# Patient Record
Sex: Female | Born: 1970 | Race: White | Hispanic: No | Marital: Single | Smoking: Current every day smoker
Health system: Southern US, Community
[De-identification: ages and names within clinical notes are randomized; demographics above are authoritative.]

## PROBLEM LIST (undated history)

## (undated) DIAGNOSIS — R569 Unspecified convulsions: Secondary | ICD-10-CM

---

## 1997-09-24 ENCOUNTER — Other Ambulatory Visit: Admission: RE | Admit: 1997-09-24 | Discharge: 1997-09-24 | Payer: Self-pay | Admitting: Obstetrics

## 1997-11-04 ENCOUNTER — Emergency Department (HOSPITAL_COMMUNITY): Admission: EM | Admit: 1997-11-04 | Discharge: 1997-11-04 | Payer: Self-pay | Admitting: Emergency Medicine

## 1998-07-21 ENCOUNTER — Emergency Department (HOSPITAL_COMMUNITY): Admission: EM | Admit: 1998-07-21 | Discharge: 1998-07-21 | Payer: Self-pay | Admitting: Internal Medicine

## 1999-01-31 ENCOUNTER — Other Ambulatory Visit: Admission: RE | Admit: 1999-01-31 | Discharge: 1999-01-31 | Payer: Self-pay | Admitting: Family Medicine

## 1999-08-12 ENCOUNTER — Encounter: Payer: Self-pay | Admitting: Obstetrics and Gynecology

## 1999-08-12 ENCOUNTER — Encounter: Admission: RE | Admit: 1999-08-12 | Discharge: 1999-08-12 | Payer: Self-pay | Admitting: Obstetrics and Gynecology

## 1999-11-15 ENCOUNTER — Emergency Department (HOSPITAL_COMMUNITY): Admission: EM | Admit: 1999-11-15 | Discharge: 1999-11-16 | Payer: Self-pay | Admitting: Emergency Medicine

## 2002-01-28 ENCOUNTER — Encounter: Admission: RE | Admit: 2002-01-28 | Discharge: 2002-01-28 | Payer: Self-pay | Admitting: Family Medicine

## 2002-01-28 ENCOUNTER — Encounter: Payer: Self-pay | Admitting: Family Medicine

## 2002-06-09 ENCOUNTER — Emergency Department (HOSPITAL_COMMUNITY): Admission: EM | Admit: 2002-06-09 | Discharge: 2002-06-09 | Payer: Self-pay | Admitting: Emergency Medicine

## 2002-06-09 ENCOUNTER — Encounter: Payer: Self-pay | Admitting: Emergency Medicine

## 2005-02-22 ENCOUNTER — Emergency Department (HOSPITAL_COMMUNITY): Admission: EM | Admit: 2005-02-22 | Discharge: 2005-02-22 | Payer: Self-pay | Admitting: Emergency Medicine

## 2006-03-19 ENCOUNTER — Encounter: Admission: RE | Admit: 2006-03-19 | Discharge: 2006-03-19 | Payer: Self-pay | Admitting: Family Medicine

## 2009-01-13 ENCOUNTER — Other Ambulatory Visit: Payer: Self-pay

## 2009-01-13 ENCOUNTER — Other Ambulatory Visit: Payer: Self-pay | Admitting: Emergency Medicine

## 2009-01-13 ENCOUNTER — Emergency Department (HOSPITAL_COMMUNITY): Admission: EM | Admit: 2009-01-13 | Discharge: 2009-01-14 | Payer: Self-pay | Admitting: Emergency Medicine

## 2009-01-14 ENCOUNTER — Emergency Department (HOSPITAL_COMMUNITY): Admission: EM | Admit: 2009-01-14 | Discharge: 2009-01-14 | Payer: Self-pay | Admitting: Emergency Medicine

## 2009-01-14 ENCOUNTER — Ambulatory Visit: Payer: Self-pay | Admitting: *Deleted

## 2009-01-14 ENCOUNTER — Inpatient Hospital Stay (HOSPITAL_COMMUNITY): Admission: AD | Admit: 2009-01-14 | Discharge: 2009-01-20 | Payer: Self-pay | Admitting: *Deleted

## 2009-01-20 ENCOUNTER — Inpatient Hospital Stay (HOSPITAL_COMMUNITY): Admission: EM | Admit: 2009-01-20 | Discharge: 2009-01-24 | Payer: Self-pay | Admitting: Emergency Medicine

## 2009-01-24 ENCOUNTER — Ambulatory Visit: Payer: Self-pay | Admitting: Psychiatry

## 2009-01-24 ENCOUNTER — Inpatient Hospital Stay (HOSPITAL_COMMUNITY): Admission: AD | Admit: 2009-01-24 | Discharge: 2009-01-25 | Payer: Self-pay | Admitting: Psychiatry

## 2009-01-26 ENCOUNTER — Inpatient Hospital Stay (HOSPITAL_COMMUNITY): Admission: EM | Admit: 2009-01-26 | Discharge: 2009-01-28 | Payer: Self-pay | Admitting: Emergency Medicine

## 2009-01-28 ENCOUNTER — Inpatient Hospital Stay (HOSPITAL_COMMUNITY): Admission: RE | Admit: 2009-01-28 | Discharge: 2009-02-10 | Payer: Self-pay | Admitting: Psychiatry

## 2009-05-16 DIAGNOSIS — F3289 Other specified depressive episodes: Secondary | ICD-10-CM | POA: Insufficient documentation

## 2009-05-16 DIAGNOSIS — M2550 Pain in unspecified joint: Secondary | ICD-10-CM | POA: Insufficient documentation

## 2010-05-28 ENCOUNTER — Encounter: Payer: Self-pay | Admitting: Family Medicine

## 2010-08-11 LAB — TRICYCLICS SCREEN, URINE: TCA Scrn: NOT DETECTED

## 2010-08-11 LAB — BASIC METABOLIC PANEL
BUN: 7 mg/dL (ref 6–23)
CO2: 22 mEq/L (ref 19–32)
Calcium: 8.2 mg/dL — ABNORMAL LOW (ref 8.4–10.5)
Calcium: 9 mg/dL (ref 8.4–10.5)
Chloride: 110 mEq/L (ref 96–112)
Chloride: 107 mEq/L (ref 96–112)
GFR calc Af Amer: >60 mL/min (ref >60)
GFR calc Af Amer: >60 mL/min (ref >60)
GFR calc non Af Amer: >60 mL/min (ref >60)
Glucose, Bld: 99 mg/dL (ref 70–99)
Glucose, Bld: 104 mg/dL — ABNORMAL HIGH (ref 70–99)
Potassium: 3.7 mEq/L (ref 3.5–5.1)
Potassium: 4 mEq/L (ref 3.5–5.1)
Sodium: 137 mEq/L (ref 135–145)
Sodium: 140 mEq/L (ref 135–145)
Sodium: 138 mEq/L (ref 135–145)

## 2010-08-11 LAB — CBC
HCT: 38.4 % (ref 36.0–46.0)
HCT: 36.7 % (ref 36.0–46.0)
HCT: 36.8 % (ref 36.0–46.0)
HCT: 39.7 % (ref 36.0–46.0)
Hemoglobin: 13 g/dL (ref 12.0–15.0)
Hemoglobin: 12.2 g/dL (ref 12.0–15.0)
Hemoglobin: 13.2 g/dL (ref 12.0–15.0)
MCHC: 33.8 g/dL (ref 30.0–36.0)
MCHC: 32.7 g/dL (ref 30.0–36.0)
MCHC: 34 g/dL (ref 30.0–36.0)
MCV: 86.7 fL (ref 78.0–100.0)
MCV: 87.2 fL (ref 78.0–100.0)
MCV: 87.6 fL (ref 78.0–100.0)
MCV: 87.8 fL (ref 78.0–100.0)
Platelets: 234 10*3/uL (ref 150–400)
Platelets: 178 10*3/uL (ref 150–400)
Platelets: 183 10*3/uL (ref 150–400)
Platelets: 187 10*3/uL (ref 150–400)
Platelets: 201 10*3/uL (ref 150–400)
RBC: 4.6 MIL/uL (ref 3.87–5.11)
RDW: 13.3 % (ref 11.5–15.5)
RDW: 13.4 % (ref 11.5–15.5)
RDW: 13.7 % (ref 11.5–15.5)
RDW: 13.6 % (ref 11.5–15.5)
WBC: 10.9 10*3/uL — ABNORMAL HIGH (ref 4.0–10.5)
WBC: 15.1 10*3/uL — ABNORMAL HIGH (ref 4.0–10.5)
WBC: 7.1 10*3/uL (ref 4.0–10.5)
WBC: 6.5 10*3/uL (ref 4.0–10.5)

## 2010-08-11 LAB — DIFFERENTIAL
Basophils Absolute: 0 10*3/uL (ref 0.0–0.1)
Eosinophils Absolute: 0 10*3/uL (ref 0.0–0.7)
Eosinophils Relative: 0 % (ref 0–5)
Eosinophils Relative: 0 % (ref 0–5)
Eosinophils Relative: 0 % (ref 0–5)
Eosinophils Relative: 0 % (ref 0–5)
Lymphocytes Relative: 26 % (ref 12–46)
Lymphocytes Relative: 14 % (ref 12–46)
Lymphocytes Relative: 7 % — ABNORMAL LOW (ref 12–46)
Lymphocytes Relative: 24 % (ref 12–46)
Lymphs Abs: 2.1 10*3/uL (ref 0.7–4.0)
Lymphs Abs: 1.6 10*3/uL (ref 0.7–4.0)
Monocytes Absolute: 0.5 10*3/uL (ref 0.1–1.0)
Monocytes Absolute: 0.5 10*3/uL (ref 0.1–1.0)
Monocytes Absolute: 1 10*3/uL (ref 0.1–1.0)
Monocytes Absolute: 0.2 10*3/uL (ref 0.1–1.0)
Monocytes Relative: 4 % (ref 3–12)
Neutro Abs: 4.7 10*3/uL (ref 1.7–7.7)
Neutrophils Relative %: 68 % (ref 43–77)

## 2010-08-11 LAB — URINALYSIS, ROUTINE W REFLEX MICROSCOPIC
Bilirubin Urine: NEGATIVE
Bilirubin Urine: NEGATIVE
Glucose, UA: NEGATIVE mg/dL
Hgb urine dipstick: NEGATIVE
Ketones, ur: 15 mg/dL — AB
Nitrite: NEGATIVE
Nitrite: NEGATIVE
Nitrite: NEGATIVE
Specific Gravity, Urine: 1.023 (ref 1.005–1.030)
Specific Gravity, Urine: 1.026 (ref 1.005–1.030)
Urobilinogen, UA: 0.2 mg/dL (ref 0.0–1.0)
Urobilinogen, UA: 0.2 mg/dL (ref 0.0–1.0)
pH: 6 (ref 5.0–8.0)
pH: 5.5 (ref 5.0–8.0)
pH: 6 (ref 5.0–8.0)

## 2010-08-11 LAB — PHOSPHORUS: Phosphorus: 3.2 mg/dL (ref 2.3–4.6)

## 2010-08-11 LAB — COMPREHENSIVE METABOLIC PANEL
AST: 39 U/L — ABNORMAL HIGH (ref 0–37)
AST: 53 U/L — ABNORMAL HIGH (ref 0–37)
Albumin: 3.1 g/dL — ABNORMAL LOW (ref 3.5–5.2)
Albumin: 3.5 g/dL (ref 3.5–5.2)
Alkaline Phosphatase: 49 U/L (ref 39–117)
BUN: 14 mg/dL (ref 6–23)
Calcium: 8.5 mg/dL (ref 8.4–10.5)
Chloride: 110 mEq/L (ref 96–112)
Creatinine, Ser: 0.8 mg/dL (ref 0.4–1.2)
Creatinine, Ser: 0.75 mg/dL (ref 0.4–1.2)
Creatinine, Ser: 0.86 mg/dL (ref 0.4–1.2)
GFR calc Af Amer: >60 mL/min (ref >60)
GFR calc Af Amer: >60 mL/min (ref >60)
Glucose, Bld: 106 mg/dL — ABNORMAL HIGH (ref 70–99)
Potassium: 3.9 mEq/L (ref 3.5–5.1)
Potassium: 3.8 mEq/L (ref 3.5–5.1)
Total Bilirubin: 0.6 mg/dL (ref 0.3–1.2)
Total Protein: 6.7 g/dL (ref 6.0–8.3)
Total Protein: 6 g/dL (ref 6.0–8.3)

## 2010-08-11 LAB — BASIC METABOLIC PANEL WITH GFR
BUN: 12 mg/dL (ref 6–23)
Creatinine, Ser: 0.83 mg/dL (ref 0.4–1.2)
GFR calc non Af Amer: >60 mL/min (ref >60)

## 2010-08-11 LAB — GLUCOSE, CAPILLARY: Glucose-Capillary: 97 mg/dL (ref 70–99)

## 2010-08-11 LAB — POCT PREGNANCY, URINE: Preg Test, Ur: NEGATIVE

## 2010-08-11 LAB — URINE CULTURE: Colony Count: NO GROWTH

## 2010-08-11 LAB — ETHANOL
Alcohol, Ethyl (B): <5 mg/dL (ref 0–10)
Alcohol, Ethyl (B): <5 mg/dL (ref 0–10)

## 2010-08-11 LAB — MAGNESIUM
Magnesium: 2.4 mg/dL (ref 1.5–2.5)
Magnesium: 2.6 mg/dL — ABNORMAL HIGH (ref 1.5–2.5)

## 2010-08-11 LAB — URINE MICROSCOPIC-ADD ON

## 2010-08-11 LAB — RPR: RPR Ser Ql: NONREACTIVE

## 2010-08-11 LAB — FOLATE RBC: RBC Folate: 1694 ng/mL — ABNORMAL HIGH (ref 180–600)

## 2010-08-11 LAB — POCT I-STAT, CHEM 8
Hemoglobin: 13.6 g/dL (ref 12.0–15.0)
Potassium: 3.8 mEq/L (ref 3.5–5.1)
Sodium: 139 mEq/L (ref 135–145)
TCO2: 27 mmol/L (ref 0–100)

## 2010-08-11 LAB — RAPID URINE DRUG SCREEN, HOSP PERFORMED
Cocaine: NOT DETECTED
Tetrahydrocannabinol: NOT DETECTED

## 2010-08-11 LAB — CALCIUM: Calcium: 8 mg/dL — ABNORMAL LOW (ref 8.4–10.5)

## 2010-08-11 LAB — TSH: TSH: 2.504 u[IU]/mL (ref 0.350–4.500)

## 2010-08-11 LAB — VITAMIN B12: Vitamin B-12: 480 pg/mL (ref 211–911)

## 2010-08-11 LAB — PREGNANCY, URINE: Preg Test, Ur: NEGATIVE

## 2013-12-23 DIAGNOSIS — M542 Cervicalgia: Secondary | ICD-10-CM | POA: Insufficient documentation

## 2015-02-24 ENCOUNTER — Emergency Department (HOSPITAL_COMMUNITY)
Admission: EM | Admit: 2015-02-24 | Discharge: 2015-02-25 | Disposition: A | Discharge status: Home / Self Care | Payer: BLUE CROSS/BLUE SHIELD | Attending: Emergency Medicine | Admitting: Emergency Medicine

## 2015-02-24 ENCOUNTER — Encounter (HOSPITAL_COMMUNITY): Payer: Self-pay | Admitting: Emergency Medicine

## 2015-02-24 DIAGNOSIS — Y9289 Other specified places as the place of occurrence of the external cause: Secondary | ICD-10-CM | POA: Diagnosis not present

## 2015-02-24 DIAGNOSIS — Y288XXA Contact with other sharp object, undetermined intent, initial encounter: Secondary | ICD-10-CM | POA: Diagnosis not present

## 2015-02-24 DIAGNOSIS — Y998 Other external cause status: Secondary | ICD-10-CM | POA: Diagnosis not present

## 2015-02-24 DIAGNOSIS — Z23 Encounter for immunization: Secondary | ICD-10-CM | POA: Insufficient documentation

## 2015-02-24 DIAGNOSIS — Y9389 Activity, other specified: Secondary | ICD-10-CM | POA: Diagnosis not present

## 2015-02-24 DIAGNOSIS — S61012A Laceration without foreign body of left thumb without damage to nail, initial encounter: Secondary | ICD-10-CM | POA: Diagnosis not present

## 2015-02-24 HISTORY — DX: Unspecified convulsions: R56.9

## 2015-02-24 MED ORDER — TETANUS-DIPHTH-ACELL PERTUSSIS 5-2.5-18.5 LF-MCG/0.5 IM SUSP
0.5000 mL | Freq: Once | INTRAMUSCULAR | Status: AC
  Administered 2015-02-24: 0.5 mL via INTRAMUSCULAR
  Filled 2015-02-24: qty 0.5

## 2015-02-24 MED ORDER — LIDOCAINE HCL 2 % IJ SOLN
10.0000 mL | Freq: Once | INTRAMUSCULAR | Status: AC
  Administered 2015-02-24: 200 mg
  Filled 2015-02-24: qty 20

## 2015-02-24 NOTE — ED Provider Notes (Signed)
CSN: [id number]     Arrival date & time 02/24/15  2156 History  By signing my name below, I, Cynthia Miranda, attest that this documentation has been prepared under the direction and in the presence of Antony MaduraKelly Monchel Pollitt, PA-C Electronically Signed: Soijett Miranda, ED Scribe. 02/24/2015. 11:19 PM.   Chief Complaint  Patient presents with  . Extremity Laceration      The history is provided by the patient. No language interpreter was used.    Cynthia Miranda L Catalano is a 44 y.o. female who presents to the Emergency Department complaining of laceration onset 9:30 PM. She reports that she cut the tip of her left thumb while using a box cutter to cut zip ties off a box cutter. She notes that her last tetanus shot was in 2008. She describes her right thumb pain as a sharp pain. She states that she has not tried any medications for the relief of her symptoms. She denies color change, swelling, and any other symptoms. She notes that she just moved to the area and does not currently have a PCP.   Past Medical History  Diagnosis Date  . Seizures (HCC)    History reviewed. No pertinent past surgical history. History reviewed. No pertinent family history. Social History  Substance Use Topics  . Smoking status: None  . Smokeless tobacco: None  . Alcohol Use: None   OB History    No data available      Review of Systems  Musculoskeletal: Positive for arthralgias. Negative for joint swelling.  Skin: Positive for wound. Negative for color change and rash.    Allergies  Cephalexin and Sulfa antibiotics  Home Medications   Prior to Admission medications   Medication Sig Start Date End Date Taking? Authorizing Provider  bacitracin ointment Apply 1 application topically 2 (two) times daily. 02/25/15   Antony MaduraKelly Jaylin Benzel, PA-C   BP 116/63 mmHg  Pulse 91  Temp(Src) 98.5 F (36.9 C) (Oral)  Resp 18  SpO2 98%   Physical Exam  Constitutional: She is oriented to person, place, and time. She appears well-developed and  well-nourished. No distress.  HENT:  Head: Normocephalic and atraumatic.  Eyes: Conjunctivae and EOM are normal. No scleral icterus.  Neck: Normal range of motion.  Cardiovascular: Normal rate, regular rhythm and intact distal pulses.   Pulses:      Radial pulses are 2+ on the left side.  Capillary refill brisk in all digits of L hand.  Pulmonary/Chest: Effort normal. No respiratory distress.  Musculoskeletal: Normal range of motion.       Left hand: She exhibits tenderness and laceration. She exhibits normal range of motion, no bony tenderness, normal capillary refill and no deformity. Normal sensation noted. Normal strength noted.       Hands: 1cm curved laceration to the fat pad of the L thumb  Neurological: She is alert and oriented to person, place, and time. She exhibits normal muscle tone. Coordination normal.  Sensation to light touch intact to the distal tip of the L thumb. 5/5 strength against resistance of flexors and extensors of L thumb.  Skin: Skin is warm and dry. No rash noted. She is not diaphoretic. No erythema. No pallor.  Psychiatric: She has a normal mood and affect. Her behavior is normal.  Nursing note and vitals reviewed.   ED Course  Procedures (including critical care time) DIAGNOSTIC STUDIES: Oxygen Saturation is 98% on RA, nl by my interpretation.    COORDINATION OF CARE: 11:15 PM Discussed treatment plan  with pt at bedside which includes laceration repair, update tetanus, and f/u in 14 days for suture removal and pt agreed to plan.  Labs Review Labs Reviewed - No data to display  Imaging Review No results found.   I have personally reviewed and evaluated these images and lab results as part of my medical decision-making.   EKG Interpretation None       LACERATION REPAIR Performed by: Antony Madura Authorized by: Antony Madura Consent: Verbal consent obtained. Risks and benefits: risks, benefits and alternatives were discussed Consent given  by: patient Patient identity confirmed: provided demographic data Prepped and Draped in normal sterile fashion Wound explored  Laceration Location: L thumb  Laceration Length: 1cm  No Foreign Bodies seen or palpated  Anesthesia: digital block  Local anesthetic: lidocaine 2% without epinephrine  Anesthetic total: 6 ml  Irrigation method: syringe Amount of cleaning: standard  Skin closure: 5-0 prolene  Number of sutures: 4  Technique: simple interrupted  Patient tolerance: Patient tolerated the procedure well with no immediate complications.  MDM   Final diagnoses:  Thumb laceration, left, initial encounter    Tdap booster given. Pressure irrigation performed. Laceration occurred < 8 hours prior to repair which was well tolerated. Pt has no comorbidities to effect normal wound healing. Discussed suture home care with pt and answered questions. Pt to follow up for wound check and suture removal in 14 days. Pt is hemodynamically stable with no complaints prior to discharge   I, [name], personally performed the services described in this documentation. All medical record entries made by the scribe were at my direction and in my presence.  I have reviewed the chart and discharge instructions and agree that the record reflects my personal performance and is accurate and complete. Timohty Renbarger.  02/25/2015. 12:22 AM.      Antony Madura, PA-C 02/25/15 0022  Loren Racer, MD 02/26/15 [id number]

## 2015-02-24 NOTE — ED Notes (Addendum)
Pt was using a box cutter and sliced tip of left thumb. Superficial laceration with moderate amount of blood oozing from side

## 2015-02-25 MED ORDER — BACITRACIN ZINC 500 UNIT/GM EX OINT: 1.0000 "application " | TOPICAL_OINTMENT | Freq: Two times a day (BID) | CUTANEOUS | Status: AC

## 2015-02-25 NOTE — Discharge Instructions (Signed)

## 2018-07-11 DIAGNOSIS — M25521 Pain in right elbow: Secondary | ICD-10-CM | POA: Insufficient documentation

## 2018-07-12 DIAGNOSIS — M7711 Lateral epicondylitis, right elbow: Secondary | ICD-10-CM | POA: Insufficient documentation

## 2020-11-21 ENCOUNTER — Other Ambulatory Visit: Payer: Self-pay | Admitting: Obstetrics and Gynecology

## 2020-11-21 DIAGNOSIS — R928 Other abnormal and inconclusive findings on diagnostic imaging of breast: Secondary | ICD-10-CM

## 2020-11-25 ENCOUNTER — Other Ambulatory Visit: Payer: Self-pay

## 2020-11-25 ENCOUNTER — Ambulatory Visit: Admission: RE | Admit: 2020-11-25 | Payer: BC Managed Care – PPO | Source: Ambulatory Visit

## 2020-11-25 ENCOUNTER — Ambulatory Visit
Admission: RE | Admit: 2020-11-25 | Discharge: 2020-11-25 | Disposition: A | Discharge status: Home / Self Care | Payer: BC Managed Care – PPO | Source: Ambulatory Visit | Attending: Obstetrics and Gynecology | Admitting: Obstetrics and Gynecology

## 2020-11-25 DIAGNOSIS — R928 Other abnormal and inconclusive findings on diagnostic imaging of breast: Secondary | ICD-10-CM

## 2022-01-12 ENCOUNTER — Ambulatory Visit
Admission: RE | Admit: 2022-01-12 | Discharge: 2022-01-12 | Disposition: A | Discharge status: Home / Self Care | Payer: Self-pay | Source: Ambulatory Visit | Attending: Family Medicine | Admitting: Family Medicine

## 2022-01-12 ENCOUNTER — Ambulatory Visit (INDEPENDENT_AMBULATORY_CARE_PROVIDER_SITE_OTHER): Payer: Self-pay

## 2022-01-12 ENCOUNTER — Ambulatory Visit
Admission: RE | Admit: 2022-01-12 | Discharge: 2022-01-12 | Disposition: A | Discharge status: Home / Self Care | Payer: Self-pay | Source: Ambulatory Visit

## 2022-01-12 VITALS — BP 114/75 | HR 96 | Temp 99.4°F | Resp 16 | Ht 67.0 in | Wt 160.0 lb

## 2022-01-12 DIAGNOSIS — R0781 Pleurodynia: Secondary | ICD-10-CM

## 2022-01-12 DIAGNOSIS — F17200 Nicotine dependence, unspecified, uncomplicated: Secondary | ICD-10-CM | POA: Insufficient documentation

## 2022-01-12 DIAGNOSIS — G4700 Insomnia, unspecified: Secondary | ICD-10-CM | POA: Insufficient documentation

## 2022-01-12 DIAGNOSIS — R0789 Other chest pain: Secondary | ICD-10-CM

## 2022-01-12 NOTE — ED Triage Notes (Signed)
Right sided rib pain x 1 month  Denies injury  Recently quit her job which was stressful  OTC  ibuprofen 400mg  last night - helps w/ pain  Was seen at [facility] [address]  earlier today - was directed here due to no radiology avail at that site

## 2022-01-12 NOTE — ED Provider Notes (Signed)
[facility]    CSN: [id number] Arrival date & time: 01/12/22  1609      History   Chief Complaint Chief Complaint  Patient presents with   Chest Pain    right    HPI ROSALITA CAREY is a 51 y.o. female.   HPI 51 year old female presents with right-sided rib pain for 1 month.  Denies injury to this area.  Patient reports recently quit her job which was quite stressful.  Patient was initially seen/evaluated in urgent care [address] and was told to come here as they had no red tach on duty today.  PMH significant for nicotine dependence, seizures, pain in joints, and insomnia.  Past Medical History:  Diagnosis Date   Seizures [facility])     Patient Active Problem List   Diagnosis Date Noted   Insomnia 01/12/2022   Smoker 01/12/2022   Lateral epicondylitis of right elbow 07/12/2018   Pain in joint of right elbow 07/11/2018   Neck pain of over 3 months duration 12/23/2013   Depressive disorder, not elsewhere classified 05/16/2009   Pain in joints 05/16/2009    History reviewed. No pertinent surgical history.  OB History   No obstetric history on file.      Home Medications    Prior to Admission medications   Medication Sig Start Date End Date Taking? Authorizing Provider  bacitracin ointment Apply 1 application topically 2 (two) times daily. Patient not taking: Reported on 01/12/2022 02/25/15   Antony Madura, PA-C  Calcium Carbonate-Vitamin D 250-3.125 MG-MCG TABS Take 1 tablet by mouth daily. Patient not taking: Reported on 01/12/2022    [provider]  cetirizine (ZYRTEC) 10 MG tablet Take by mouth.    [provider]  venlafaxine XR (EFFEXOR-XR) 75 MG 24 hr capsule TAKE THREE A DAY    [provider]  VYVANSE 40 MG capsule Take 40 mg by mouth every morning. 12/15/21   [provider]    Family History Family History  Problem Relation Age of Onset   Thyroid disease Mother    Parkinson's disease Mother    Lung cancer Father      Social History Social History   Tobacco Use   Smoking status: Every Day    Packs/day: 1.00    Types: Cigarettes   Smokeless tobacco: Never  Vaping Use   Vaping Use: Never used  Substance Use Topics   Alcohol use: Not Currently   Drug use: Never     Allergies   Cephalexin and Sulfa antibiotics   Review of Systems Review of Systems  Musculoskeletal:        Chest wall pain     Physical Exam Triage Vital Signs ED Triage Vitals  Enc Vitals Group     BP      Pulse      Resp      Temp      Temp src      SpO2      Weight      Height      Head Circumference      Peak Flow      Pain Score      Pain Loc      Pain Edu?      Excl. in GC?    No data found.  Updated Vital Signs BP 114/75 (BP Location: Left Arm)   Pulse 96   Temp 99.4 F (37.4 C) (Oral)   Resp 16   Ht 5\' 7"  (1.702 m)  Wt 160 lb (72.6 kg)   SpO2 96%   BMI 25.06 kg/m    Physical Exam Vitals and nursing note reviewed.  Constitutional:      Appearance: Normal appearance. She is normal weight.  HENT:     Head: Normocephalic and atraumatic.     Mouth/Throat:     Mouth: Mucous membranes are moist.     Pharynx: Oropharynx is clear.  Eyes:     Extraocular Movements: Extraocular movements intact.     Conjunctiva/sclera: Conjunctivae normal.     Pupils: Pupils are equal, round, and reactive to light.  Cardiovascular:     Rate and Rhythm: Normal rate and regular rhythm.     Pulses: Normal pulses.     Heart sounds: Normal heart sounds. No murmur heard.    No friction rub. No gallop.  Pulmonary:     Effort: Pulmonary effort is normal.     Breath sounds: Normal breath sounds. No wheezing, rhonchi or rales.  Musculoskeletal:        General: Normal range of motion.     Cervical back: Normal range of motion and neck supple. No tenderness.  Lymphadenopathy:     Cervical: No cervical adenopathy.  Skin:    General: Skin is warm and dry.  Neurological:     General: No focal deficit present.      Mental Status: She is alert and oriented to person, place, and time.      UC Treatments / Results  Labs (all labs ordered are listed, but only abnormal results are displayed) Labs Reviewed - No data to display  EKG   Radiology DG Chest 2 View  Result Date: 01/12/2022 CLINICAL DATA:  Right rib pain and chest wall pain 1 month anteriorly. EXAM: CHEST - 2 VIEW COMPARISON:  01/25/2009 FINDINGS: Lungs are adequately inflated without focal airspace consolidation, effusion or pneumothorax. Cardiomediastinal silhouette is normal. Remaining bony structures are normal. No definite right rib fracture. IMPRESSION: No active cardiopulmonary disease. Electronically Signed   By: Elberta Fortis M.D.   On: 01/12/2022 17:05    Procedures Procedures (including critical care time)  Medications Ordered in UC Medications - No data to display  Initial Impression / Assessment and Plan / UC Course  I have reviewed the triage vital signs and the nursing notes.  Pertinent labs & imaging results that were available during my care of the patient were reviewed by me and considered in my medical decision making (see chart for details).     MDM: 1.  Chest wall pain-CXR revealed above. Advised patient of chest x-ray results with hard copy provided to patient.  Advised patient if symptoms worsen and/or unresolved please follow-up PCP, nearest emergency department, or here for further evaluation.  Patient discharged home, hemodynamically stable. Final Clinical Impressions(s) / UC Diagnoses   Final diagnoses:  Chest wall pain     Discharge Instructions      Advised patient of chest x-ray results with hard copy provided to patient.  Advised patient if symptoms worsen and/or unresolved please follow-up PCP, nearest emergency department, or here for further evaluation.     ED Prescriptions   None    PDMP not reviewed this encounter.   Trevor Iha, FNP 01/12/22 1735

## 2022-01-12 NOTE — ED Provider Notes (Signed)
Patient complaining of pain in her right rib cage for the past month without known acute injury.  Patient advised imaging of her chest would be a good place to start and that we do not have radiology technician today.  Patient redirected to an urgent care facility that does have a radiology technician.   Theadora Rama Scales, PA-C 01/12/22 1434

## 2022-01-12 NOTE — Discharge Instructions (Addendum)
Advised patient of chest x-ray results with hard copy provided to patient.  Advised patient if symptoms worsen and/or unresolved please follow-up PCP, nearest emergency department, or here for further evaluation.

## 2022-01-12 NOTE — ED Triage Notes (Signed)
Patient presents to UC for right sided rib discomfort x 1 month. Taking ibuprofen. No known injury. Unsure if she slept wrong. States hurts when taking a deep breathe or any movement.

## 2022-03-27 ENCOUNTER — Other Ambulatory Visit: Payer: Self-pay | Admitting: Obstetrics and Gynecology

## 2022-03-27 DIAGNOSIS — R928 Other abnormal and inconclusive findings on diagnostic imaging of breast: Secondary | ICD-10-CM

## 2022-08-08 ENCOUNTER — Ambulatory Visit
Admission: RE | Admit: 2022-08-08 | Discharge: 2022-08-08 | Disposition: A | Discharge status: Home / Self Care | Payer: Medicaid Other | Source: Ambulatory Visit | Attending: Obstetrics and Gynecology | Admitting: Obstetrics and Gynecology

## 2022-08-08 DIAGNOSIS — R928 Other abnormal and inconclusive findings on diagnostic imaging of breast: Secondary | ICD-10-CM

## 2023-02-16 IMAGING — MG MM DIGITAL DIAGNOSTIC UNILAT*L* W/ TOMO W/ CAD
6 of 12 series · 6 of 36 positions shown · non-contrast
Comparison: Previous exam(s).

CLINICAL DATA: Patient was recalled from screening mammogram for a
possible asymmetry in the left breast.

EXAM:
DIGITAL DIAGNOSTIC UNILATERAL LEFT MAMMOGRAM WITH TOMOSYNTHESIS AND
CAD
TECHNIQUE: Left digital diagnostic mammography and breast tomosynthesis was
performed. The images were evaluated with computer-aided detection.

[L MLO synth-2D (1 of 3)]
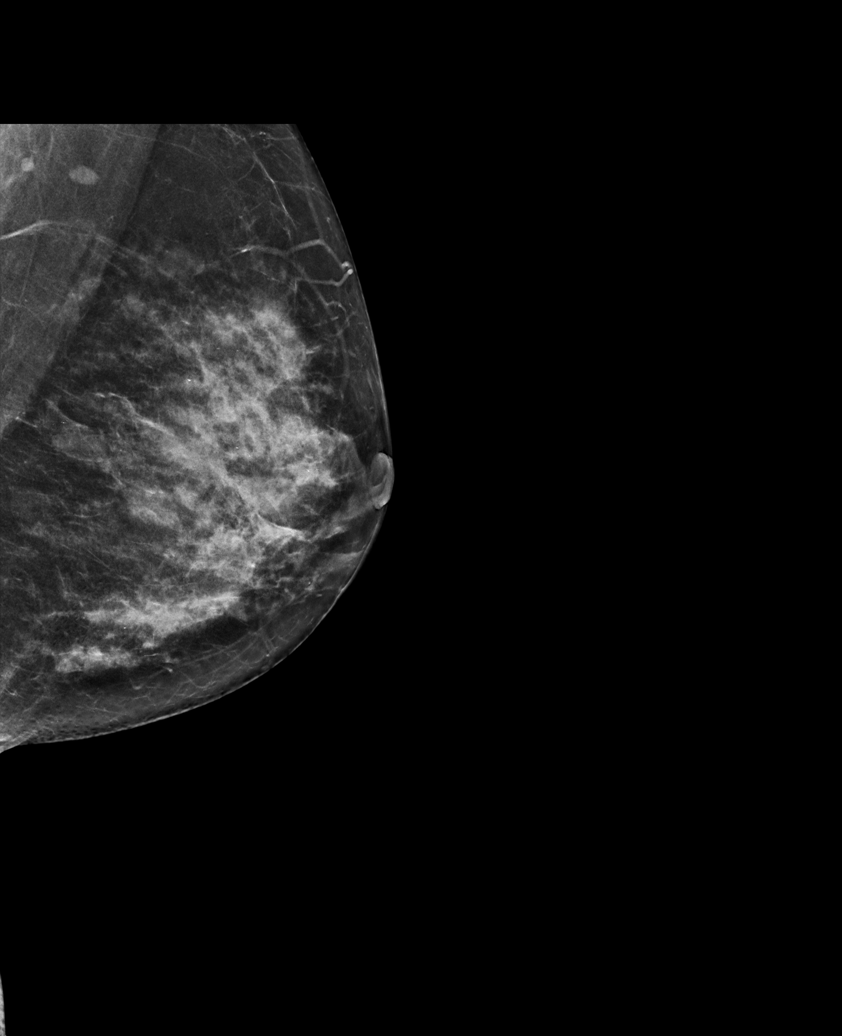

[L MLO synth-2D (2 of 3)]
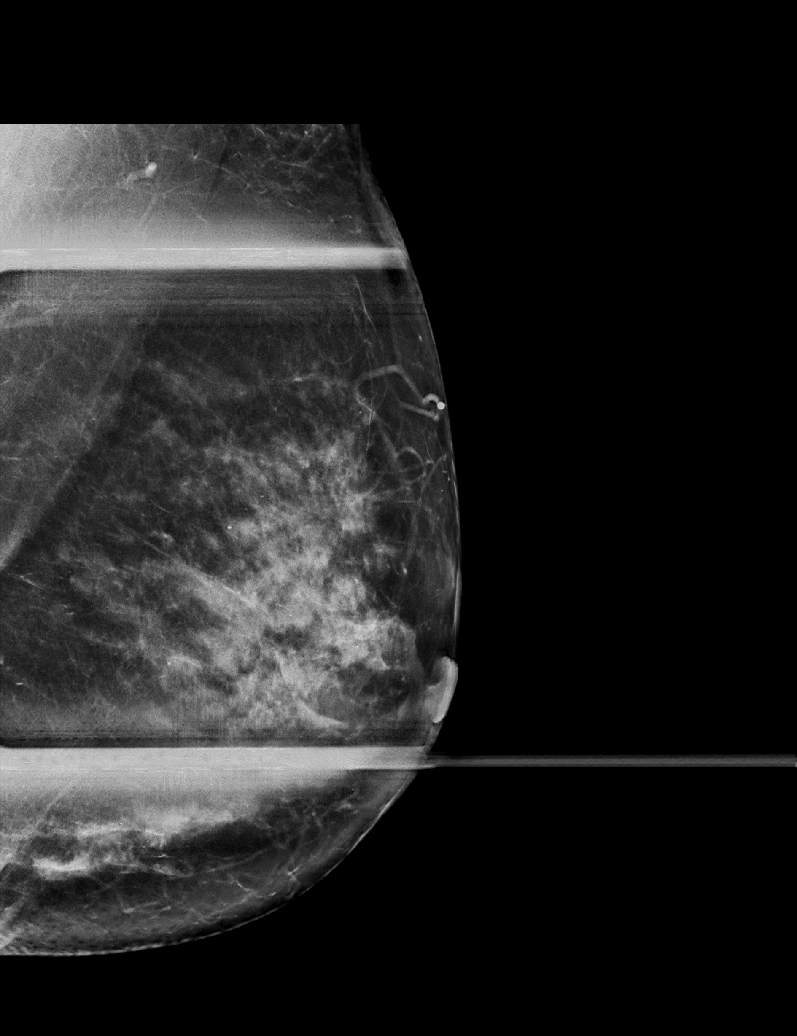

[L MLO synth-2D (3 of 3)]
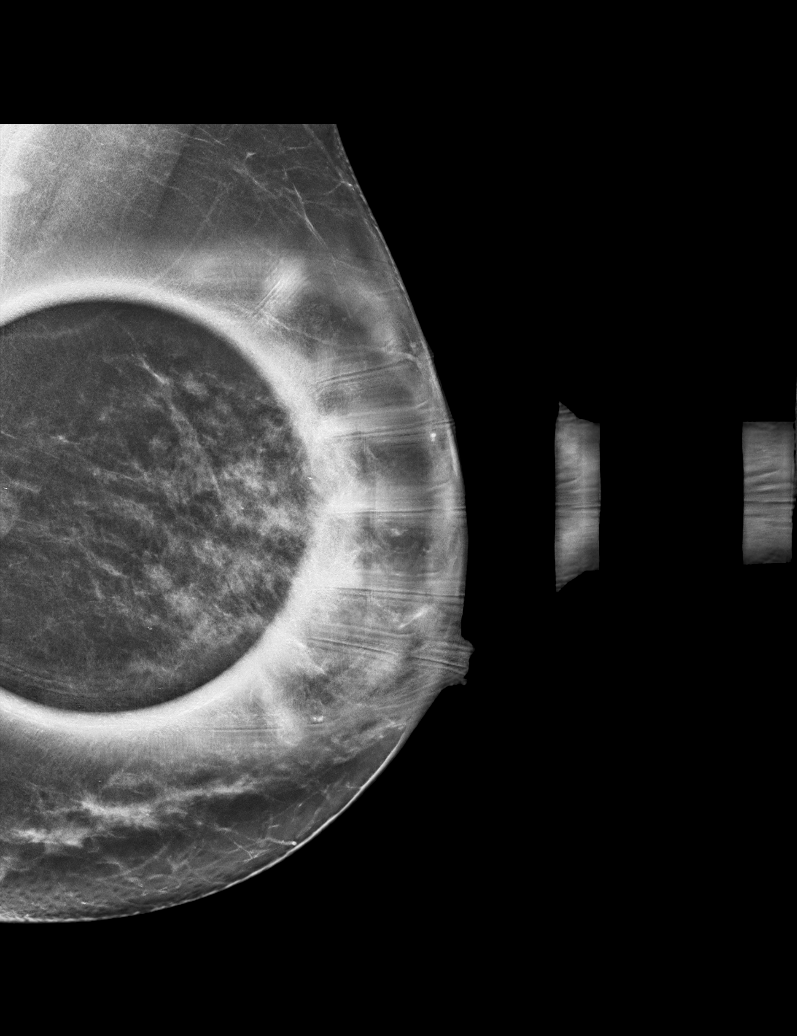

[L ML synth-2D (1 of 2)]
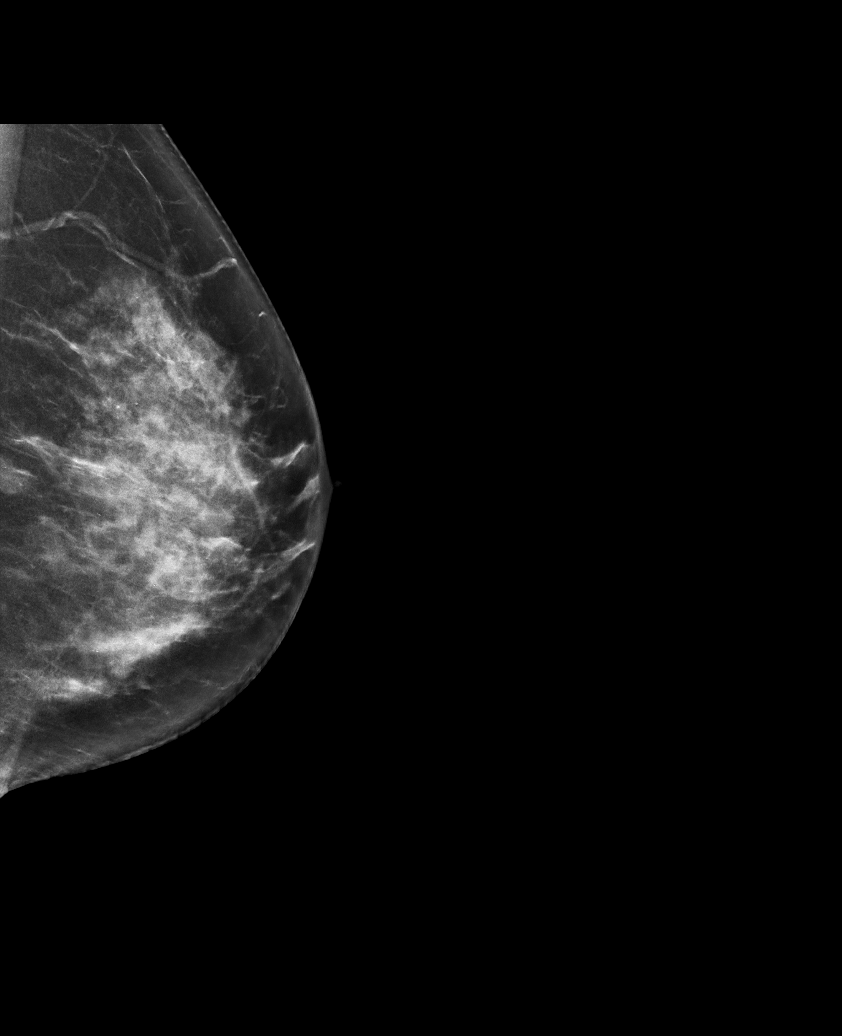

[L XCCL synth-2D]
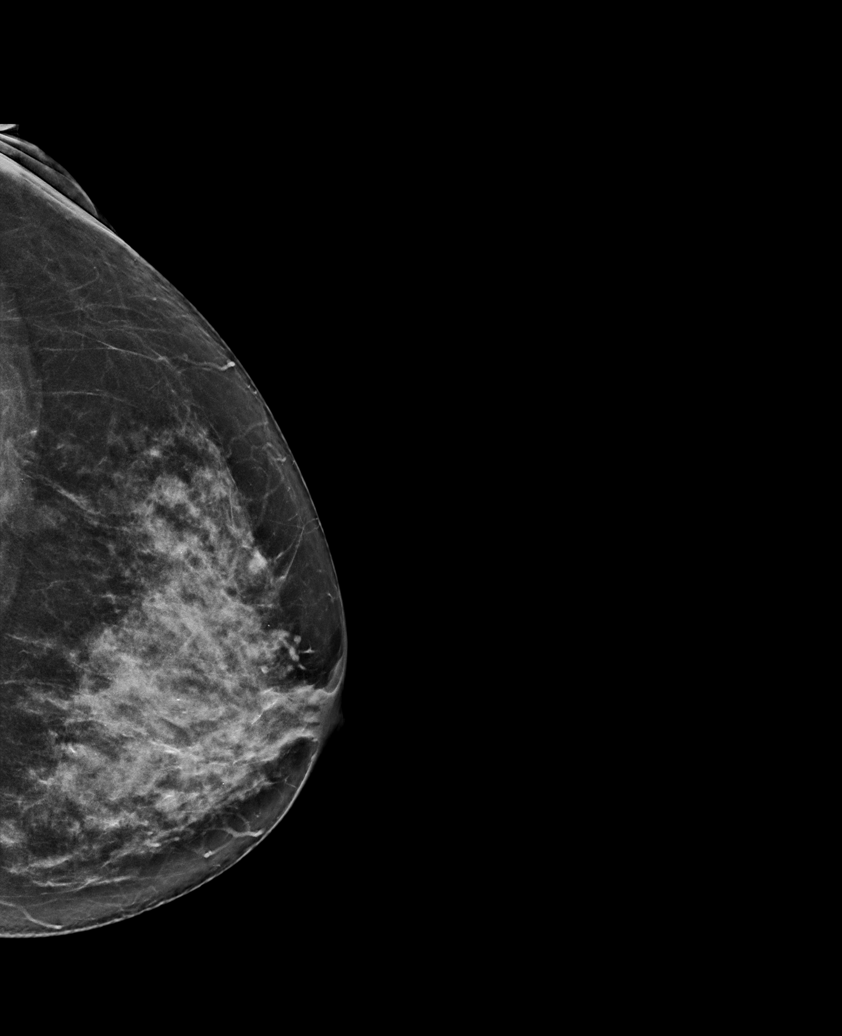

[L ML synth-2D (2 of 2)]
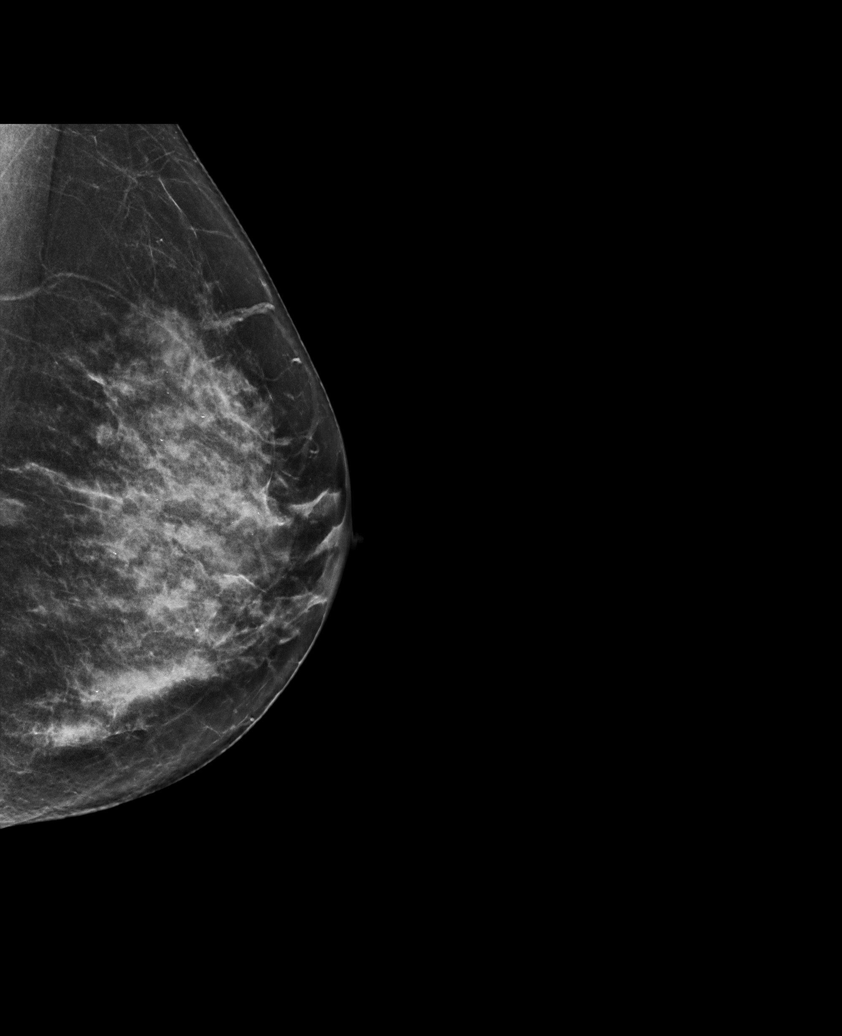

[6 of 36 positions shown; findings below may reference images not displayed]

ACR Breast Density Category c: The breast tissue is heterogeneously
dense, which may obscure small masses.
FINDINGS: Additional imaging of the left breast was performed. Parenchymal
pattern is unchanged compared to prior exams. No suspicious mass or
malignant type microcalcifications identified.
IMPRESSION: No evidence of malignancy in the left breast.

RECOMMENDATION:
Bilateral screening mammogram in 1 year is recommended.

I have discussed the findings and recommendations with the patient.
If applicable, a reminder letter will be sent to the patient
regarding the next appointment.

BI-RADS CATEGORY  1: Negative.
